# Patient Record
Sex: Female | Born: 1975 | Race: Black or African American | Hispanic: No | Marital: Married | Smoking: Former smoker
Health system: Southern US, Community
[De-identification: ages and names within clinical notes are randomized; demographics above are authoritative.]

## PROBLEM LIST (undated history)

## (undated) DIAGNOSIS — J189 Pneumonia, unspecified organism: Secondary | ICD-10-CM

## (undated) DIAGNOSIS — K859 Acute pancreatitis without necrosis or infection, unspecified: Secondary | ICD-10-CM

---

## 2013-10-16 ENCOUNTER — Emergency Department (HOSPITAL_COMMUNITY)
Admission: EM | Admit: 2013-10-16 | Discharge: 2013-10-16 | Disposition: A | Discharge status: Home / Self Care | Payer: Self-pay | Attending: Emergency Medicine | Admitting: Emergency Medicine

## 2013-10-16 ENCOUNTER — Encounter (HOSPITAL_COMMUNITY): Payer: Self-pay | Admitting: Emergency Medicine

## 2013-10-16 DIAGNOSIS — Z880 Allergy status to penicillin: Secondary | ICD-10-CM | POA: Insufficient documentation

## 2013-10-16 DIAGNOSIS — S61219A Laceration without foreign body of unspecified finger without damage to nail, initial encounter: Secondary | ICD-10-CM

## 2013-10-16 DIAGNOSIS — W208XXA Other cause of strike by thrown, projected or falling object, initial encounter: Secondary | ICD-10-CM | POA: Insufficient documentation

## 2013-10-16 DIAGNOSIS — Y9319 Activity, other involving water and watercraft: Secondary | ICD-10-CM | POA: Insufficient documentation

## 2013-10-16 DIAGNOSIS — Y9289 Other specified places as the place of occurrence of the external cause: Secondary | ICD-10-CM | POA: Insufficient documentation

## 2013-10-16 DIAGNOSIS — S61209A Unspecified open wound of unspecified finger without damage to nail, initial encounter: Secondary | ICD-10-CM | POA: Insufficient documentation

## 2013-10-16 NOTE — ED Notes (Signed)
Pt with laceration to left thumb from part of TV; bleeding controlled at present; pt sts last TD was 4 years ago

## 2013-10-16 NOTE — ED Provider Notes (Signed)
CSN: [id number]     Arrival date & time 10/16/13  1307 History  This chart was scribed for [employer], PA-C, working with [employer]. Rubin Payor, MD by Chestine Spore, ED Scribe. The patient was seen in room TR06C/TR06C at 1:34 PM.     Chief Complaint  Patient presents with  . Extremity Laceration     The history is provided by the patient. No language interpreter was used.   HPI Comments: Kristi Fleming is a 38 y.o. female who presents to the Emergency Department complaining of an extremity laceration onset last night. She states that she was trying to put a firestick in the back of her TV and it slipped.  She states that it slipped and cut her left thumb. She states that she is right handed. She states that she is having pain right now to her left thumb. She denies any other associated symptoms. She states that her last Tdap was 4 years ago.   History reviewed. No pertinent past medical history. History reviewed. No pertinent past surgical history. History reviewed. No pertinent family history. History  Substance Use Topics  . Smoking status: Never Smoker   . Smokeless tobacco: Not on file  . Alcohol Use: No   OB History   Grav Para Term Preterm Abortions TAB SAB Ect Mult Living                 Review of Systems  A complete 10 system review of systems was obtained and all systems are negative except as noted in the HPI and PMH.    Allergies  Clindamycin/lincomycin and Penicillins  Home Medications   Prior to Admission medications   Not on File   BP 131/79  Pulse 85  Temp(Src) 98.1 F (36.7 C) (Oral)  Resp 20  SpO2 99%  Physical Exam  Nursing note and vitals reviewed. Constitutional: She is oriented to person, place, and time. She appears well-developed and well-nourished. No distress.  HENT:  Head: Normocephalic.  Eyes: Conjunctivae and EOM are normal.  Cardiovascular: Normal rate.   Pulmonary/Chest: Effort normal. No stridor.  Musculoskeletal: Normal  range of motion.  Neurological: She is alert and oriented to person, place, and time.  Skin:  1.25 cm full-thickness laceration to distal volar side of left thumb. Full range of motion neurovascularly intact. Bleeding is controlled. No gross contamination.  Psychiatric: She has a normal mood and affect.    ED Course  Procedures (including critical care time) LACERATION REPAIR Performed by: Wynetta Emery Consent: Verbal consent obtained. Risks and benefits: risks, benefits and alternatives were discussed Consent given by: patient Patient identity confirmed: Wrist band   Wound explored to depth in good light on a bloodless field, with no foreign bodies seen or palpated.  Prepped and draped in normal sterile fashion    Tetanus: UTD  Laceration Location: Distal pad of left thumb  Laceration Length: 1.25cm  Anesthesia: digital block  Local anesthetic:  epinephrine  Anesthetic total: 5 ml  Irrigation method: Low Pressure  Amount of cleaning: 1L sterile NS  Skin closure: 5-0 ethylon  Number of sutures: 3  Technique: simple interupted  Patient tolerance: Patient tolerated the procedure well with no immediate complications.   Antibx ointment applied. Instructions for care discussed verbally and patient provided with additional written instructions for homecare and f/u.   DIAGNOSTIC STUDIES: Oxygen Saturation is 99% on room air, normal by my interpretation.    COORDINATION OF CARE: 2:10 PM-Discussed treatment plan which includes Laceration Repair with  pt at bedside and pt agreed to plan.   Labs Review Labs Reviewed - No data to display  Imaging Review No results found.   EKG Interpretation None      MDM   Final diagnoses:  Finger laceration, initial encounter    Filed Vitals:   10/16/13 1312 10/16/13 1428  BP: 131/79 110/60  Pulse: 85 83  Temp: 98.1 F (36.7 C)   TempSrc: Oral   Resp: 20 18  SpO2: 99% 97%    Medications - No data to  display  Kristi Fleming is a 38 y.o. female presenting with finger laceration.  No signs of tendon/joint involvement. Pressure irrigation performed. Laceration occurred < 8 hours prior to repair which was well tolerated. Pt has no co morbidities to effect normal wound healing. Discussed suture home care w pt and answered questions. Pt to f-u for wound check and suture removal in 7 days. Pt is hemodynamically stable with no complaints prior to dc.   Evaluation does not show pathology that would require ongoing emergent intervention or inpatient treatment. Pt is hemodynamically stable and mentating appropriately. Discussed findings and plan with patient/guardian, who agrees with care plan. All questions answered. Return precautions discussed and outpatient follow up given.    I personally performed the services described in this documentation, which was scribed in my presence. The recorded information has been reviewed and is accurate.    Wynetta Emeryicole Olivea Sonnen, PA-C 10/16/13 1715

## 2013-10-16 NOTE — Discharge Instructions (Signed)
Keep wound dry and do not remove dressing for 24 hours if possible. After that, wash gently morning and night (every 12 hours) with soap and water. Use a topical antibiotic ointment and cover with a bandaid or gauze.  °  °Do NOT use rubbing alcohol or hydrogen peroxide, do not soak the area °  °Present to your primary care doctor or the urgent care of your choice, or the ED for suture removal in 7-10 days. °  °Every attempt was made to remove foreign body (contaminants) from the wound.  However, there is always a chance that some may remain in the wound. This can  increase your risk of infection. °  °If you see signs of infection (warmth, redness, tenderness, pus, sharp increase in pain, fever, red streaking in the skin) immediately return to the emergency department. °  °After the wound heals fully, apply sunscreen for 6-12 months to minimize scarring.  ° ° °Laceration Care, Adult °A laceration is a cut or lesion that goes through all layers of the skin and into the tissue just beneath the skin. °TREATMENT  °Some lacerations may not require closure. Some lacerations may not be able to be closed due to an increased risk of infection. It is important to see your caregiver as soon as possible after an injury to minimize the risk of infection and maximize the opportunity for successful closure. °If closure is appropriate, pain medicines may be given, if needed. The wound will be cleaned to help prevent infection. Your caregiver will use stitches (sutures), staples, wound glue (adhesive), or skin adhesive strips to repair the laceration. These tools bring the skin edges together to allow for faster healing and a better cosmetic outcome. However, all wounds will heal with a scar. Once the wound has healed, scarring can be minimized by covering the wound with sunscreen during the day for 1 full year. °HOME CARE INSTRUCTIONS  °For sutures or staples: °· Keep the wound clean and dry. °· If you were given a bandage  (dressing), you should change it at least once a day. Also, change the dressing if it becomes wet or dirty, or as directed by your caregiver. °· Wash the wound with soap and water 2 times a day. Rinse the wound off with water to remove all soap. Pat the wound dry with a clean towel. °· After cleaning, apply a thin layer of the antibiotic ointment as recommended by your caregiver. This will help prevent infection and keep the dressing from sticking. °· You may shower as usual after the first 24 hours. Do not soak the wound in water until the sutures are removed. °· Only take over-the-counter or prescription medicines for pain, discomfort, or fever as directed by your caregiver. °· Get your sutures or staples removed as directed by your caregiver. °For skin adhesive strips: °· Keep the wound clean and dry. °· Do not get the skin adhesive strips wet. You may bathe carefully, using caution to keep the wound dry. °· If the wound gets wet, pat it dry with a clean towel. °· Skin adhesive strips will fall off on their own. You may trim the strips as the wound heals. Do not remove skin adhesive strips that are still stuck to the wound. They will fall off in time. °For wound adhesive: °· You may briefly wet your wound in the shower or bath. Do not soak or scrub the wound. Do not swim. Avoid periods of heavy perspiration until the skin adhesive has fallen off   on its own. After showering or bathing, gently pat the wound dry with a clean towel. °· Do not apply liquid medicine, cream medicine, or ointment medicine to your wound while the skin adhesive is in place. This may loosen the film before your wound is healed. °· If a dressing is placed over the wound, be careful not to apply tape directly over the skin adhesive. This may cause the adhesive to be pulled off before the wound is healed. °· Avoid prolonged exposure to sunlight or tanning lamps while the skin adhesive is in place. Exposure to ultraviolet light in the first  year will darken the scar. °· The skin adhesive will usually remain in place for 5 to 10 days, then naturally fall off the skin. Do not pick at the adhesive film. °You may need a tetanus shot if: °· You cannot remember when you had your last tetanus shot. °· You have never had a tetanus shot. °If you get a tetanus shot, your arm may swell, get red, and feel warm to the touch. This is common and not a problem. If you need a tetanus shot and you choose not to have one, there is a rare chance of getting tetanus. Sickness from tetanus can be serious. °SEEK MEDICAL CARE IF:  °· You have redness, swelling, or increasing pain in the wound. °· You see a red line that goes away from the wound. °· You have yellowish-white fluid (pus) coming from the wound. °· You have a fever. °· You notice a bad smell coming from the wound or dressing. °· Your wound breaks open before or after sutures have been removed. °· You notice something coming out of the wound such as wood or glass. °· Your wound is on your hand or foot and you cannot move a finger or toe. °SEEK IMMEDIATE MEDICAL CARE IF:  °· Your pain is not controlled with prescribed medicine. °· You have severe swelling around the wound causing pain and numbness or a change in color in your arm, hand, leg, or foot. °· Your wound splits open and starts bleeding. °· You have worsening numbness, weakness, or loss of function of any joint around or beyond the wound. °· You develop painful lumps near the wound or on the skin anywhere on your body. °MAKE SURE YOU:  °· Understand these instructions. °· Will watch your condition. °· Will get help right away if you are not doing well or get worse. °Document Released: 02/14/2005 Document Revised: 05/09/2011 Document Reviewed: 08/10/2010 °[employer]® Patient Information ©2015 [employer]. This information is not intended to replace advice given to you by your health care provider. Make sure you discuss any questions you have with your health  care provider. ° ° °

## 2013-10-17 NOTE — ED Provider Notes (Signed)
Medical screening examination/treatment/procedure(s) were performed by non-physician practitioner and as supervising physician I was immediately available for consultation/collaboration.   EKG Interpretation None       Ajanay Farve R. Tradarius Reinwald, MD 10/17/13 0659 

## 2013-11-16 ENCOUNTER — Emergency Department (HOSPITAL_COMMUNITY): Payer: Self-pay

## 2013-11-16 ENCOUNTER — Emergency Department (HOSPITAL_COMMUNITY)
Admission: EM | Admit: 2013-11-16 | Discharge: 2013-11-16 | Disposition: A | Discharge status: Home / Self Care | Payer: Self-pay | Attending: Emergency Medicine | Admitting: Emergency Medicine

## 2013-11-16 ENCOUNTER — Encounter (HOSPITAL_COMMUNITY): Payer: Self-pay | Admitting: Emergency Medicine

## 2013-11-16 DIAGNOSIS — Z880 Allergy status to penicillin: Secondary | ICD-10-CM | POA: Insufficient documentation

## 2013-11-16 DIAGNOSIS — K920 Hematemesis: Secondary | ICD-10-CM | POA: Insufficient documentation

## 2013-11-16 DIAGNOSIS — Z3202 Encounter for pregnancy test, result negative: Secondary | ICD-10-CM | POA: Insufficient documentation

## 2013-11-16 DIAGNOSIS — R197 Diarrhea, unspecified: Secondary | ICD-10-CM | POA: Insufficient documentation

## 2013-11-16 DIAGNOSIS — R1013 Epigastric pain: Secondary | ICD-10-CM | POA: Insufficient documentation

## 2013-11-16 DIAGNOSIS — R1011 Right upper quadrant pain: Secondary | ICD-10-CM | POA: Insufficient documentation

## 2013-11-16 DIAGNOSIS — K921 Melena: Secondary | ICD-10-CM | POA: Insufficient documentation

## 2013-11-16 HISTORY — DX: Acute pancreatitis without necrosis or infection, unspecified: K85.90

## 2013-11-16 LAB — COMPREHENSIVE METABOLIC PANEL
ALT: 15 U/L (ref 0–35)
AST: 15 U/L (ref 0–37)
Albumin: 3.5 g/dL (ref 3.5–5.2)
Alkaline Phosphatase: 67 U/L (ref 39–117)
Anion gap: 14 (ref 5–15)
BILIRUBIN TOTAL: 0.3 mg/dL (ref 0.3–1.2)
BUN: 10 mg/dL (ref 6–23)
CO2: 21 meq/L (ref 19–32)
CREATININE: 0.97 mg/dL (ref 0.50–1.10)
Calcium: 9 mg/dL (ref 8.4–10.5)
Chloride: 102 mEq/L (ref 96–112)
GFR calc Af Amer: 84 mL/min — ABNORMAL LOW (ref >90)
GFR, EST NON AFRICAN AMERICAN: 72 mL/min — AB (ref >90)
GLUCOSE: 107 mg/dL — AB (ref 70–99)
Potassium: 4 mEq/L (ref 3.7–5.3)
Sodium: 137 mEq/L (ref 137–147)
Total Protein: 7.6 g/dL (ref 6.0–8.3)

## 2013-11-16 LAB — CBC WITH DIFFERENTIAL/PLATELET
Basophils Absolute: 0 10*3/uL (ref 0.0–0.1)
Basophils Relative: 0 % (ref 0–1)
Eosinophils Absolute: 0.1 10*3/uL (ref 0.0–0.7)
Eosinophils Relative: 0 % (ref 0–5)
HEMATOCRIT: 38.5 % (ref 36.0–46.0)
Hemoglobin: 12.7 g/dL (ref 12.0–15.0)
LYMPHS ABS: 2.5 10*3/uL (ref 0.7–4.0)
LYMPHS PCT: 13 % (ref 12–46)
MCH: 27.4 pg (ref 26.0–34.0)
MCHC: 33 g/dL (ref 30.0–36.0)
MCV: 83 fL (ref 78.0–100.0)
MONO ABS: 0.9 10*3/uL (ref 0.1–1.0)
MONOS PCT: 5 % (ref 3–12)
NEUTROS ABS: 15.5 10*3/uL — AB (ref 1.7–7.7)
Neutrophils Relative %: 82 % — ABNORMAL HIGH (ref 43–77)
Platelets: 486 10*3/uL — ABNORMAL HIGH (ref 150–400)
RBC: 4.64 MIL/uL (ref 3.87–5.11)
RDW: 14.5 % (ref 11.5–15.5)
WBC: 19 10*3/uL — AB (ref 4.0–10.5)

## 2013-11-16 LAB — PREGNANCY, URINE: Preg Test, Ur: NEGATIVE

## 2013-11-16 LAB — LIPASE, BLOOD: LIPASE: 35 U/L (ref 11–59)

## 2013-11-16 LAB — TROPONIN I: Troponin I: <0.3 ng/mL (ref <0.30)

## 2013-11-16 MED ORDER — IOHEXOL 300 MG/ML  SOLN
100.0000 mL | Freq: Once | INTRAMUSCULAR | Status: AC | PRN
  Administered 2013-11-16: 100 mL via INTRAVENOUS

## 2013-11-16 MED ORDER — MORPHINE SULFATE 4 MG/ML IJ SOLN
4.0000 mg | Freq: Once | INTRAMUSCULAR | Status: AC
  Administered 2013-11-16: 4 mg via INTRAVENOUS
  Filled 2013-11-16: qty 1

## 2013-11-16 MED ORDER — OMEPRAZOLE 20 MG PO CPDR
20.0000 mg | DELAYED_RELEASE_CAPSULE | Freq: Every day | ORAL | Status: AC
Start: 2013-11-16 — End: ?

## 2013-11-16 MED ORDER — IOHEXOL 300 MG/ML  SOLN
50.0000 mL | Freq: Once | INTRAMUSCULAR | Status: AC | PRN
  Administered 2013-11-16: 50 mL via ORAL

## 2013-11-16 MED ORDER — HYDROCODONE-ACETAMINOPHEN 5-325 MG PO TABS: 1.0000 | ORAL_TABLET | ORAL | Status: AC | PRN

## 2013-11-16 MED ORDER — HYDROMORPHONE HCL 1 MG/ML IJ SOLN
1.0000 mg | Freq: Once | INTRAMUSCULAR | Status: AC
  Administered 2013-11-16: 1 mg via INTRAVENOUS
  Filled 2013-11-16: qty 1

## 2013-11-16 MED ORDER — GI COCKTAIL ~~LOC~~
30.0000 mL | Freq: Once | ORAL | Status: AC
  Administered 2013-11-16: 30 mL via ORAL
  Filled 2013-11-16: qty 30

## 2013-11-16 MED ORDER — ONDANSETRON 8 MG PO TBDP: 8.0000 mg | ORAL_TABLET | Freq: Three times a day (TID) | ORAL | Status: AC | PRN

## 2013-11-16 NOTE — ED Notes (Signed)
She states she experienced sudden onset of upper abd. Pain today at about 0900 hours which persists.  She also states she vomited x 1.  She states this is similar to pain she has had in the past which was dx as pancreatitis.

## 2013-11-16 NOTE — Discharge Instructions (Signed)

## 2013-11-16 NOTE — ED Provider Notes (Signed)
CSN: [id number]     Arrival date & time 11/16/13  1325 History   First MD Initiated Contact with Patient 11/16/13 1348     Chief Complaint  Patient presents with  . Abdominal Pain     (Consider location/radiation/quality/duration/timing/severity/associated sxs/prior Treatment) HPI Comments: Patient complains of sharp intermittent epigastric pain beginning this morning.  She reports the pain last for approximately 15-20 seconds and occurs every couple of minutes.  She reports previous history of pancreatitis with similar symptoms; No etiology discovered. Denies any history of cardiac or gallbladder problems.  Denies any fevers, chills, but did have episode on NB/NB vomiting once today. Pain does not radiate to back. Additionally reports multiple loose stools without blood beginning today after abdominal pain.     Patient is a 38 y.o. female presenting with abdominal pain.  Abdominal Pain Pain location:  Epigastric Pain quality: sharp   Pain radiates to:  Does not radiate Pain severity:  Severe Onset quality:  Sudden Duration:  4 hours Timing:  Intermittent Progression:  Unchanged Chronicity:  New Relieved by:  Nothing Worsened by:  Nothing tried Ineffective treatments:  Position changes Associated symptoms: chills, diarrhea, hematemesis, hematochezia, nausea and vomiting   Associated symptoms: no chest pain, no constipation, no cough, no dysuria, no fever, no hematuria and no shortness of breath     Past Medical History  Diagnosis Date  . Pancreatitis    No past surgical history on file. No family history on file. History  Substance Use Topics  . Smoking status: Never Smoker   . Smokeless tobacco: Not on file  . Alcohol Use: No   OB History   Grav Para Term Preterm Abortions TAB SAB Ect Mult Living                 Review of Systems  Constitutional: Positive for chills. Negative for fever.  Respiratory: Negative for cough, chest tightness and shortness of breath.    Cardiovascular: Negative for chest pain and palpitations.  Gastrointestinal: Positive for nausea, vomiting, abdominal pain, diarrhea, hematochezia and hematemesis. Negative for constipation.  Genitourinary: Negative for dysuria, urgency, hematuria and flank pain.      Allergies  Clindamycin/lincomycin; Penicillins; and Trimox  Home Medications   Prior to Admission medications   Not on File   BP 114/70  Pulse 77  Resp 16  SpO2 99%  LMP 11/05/2013 Physical Exam  ED Course  Procedures (including critical care time) Labs Review Labs Reviewed  CBC WITH DIFFERENTIAL - Abnormal; Notable for the following:    WBC 19.0 (*)    Platelets 486 (*)    Neutrophils Relative % 82 (*)    Neutro Abs 15.5 (*)    All other components within normal limits  COMPREHENSIVE METABOLIC PANEL - Abnormal; Notable for the following:    Glucose, Bld 107 (*)    GFR calc non Af Amer 72 (*)    GFR calc Af Amer 84 (*)    All other components within normal limits  LIPASE, BLOOD  TROPONIN I  PREGNANCY, URINE    Imaging Review No results found.   EKG Interpretation None      MDM   Final diagnoses:  Epigastric pain   Acute onset of epigastric pain with vomiting and diarrhea x 1 day. She has hx of pancreatitis with similar symptoms but lipase wnl. Troponin negative and EKG wnl. Due to persistent epigastric pain and WBC elevated to 19 CT abdomen pelvis ordered. Care turned over to on coming ED physician  pending CT read.    Jamal Collin, MD 11/16/13 [phone]

## 2013-11-16 NOTE — ED Notes (Signed)
Lab called, urine sent down was not labeled. Needs to be recollected. Radene Gunning, NT made aware.

## 2013-11-16 NOTE — ED Notes (Signed)
Bed: WA09 Expected date:  Expected time:  Means of arrival:  Comments: abd pain 

## 2013-11-16 NOTE — ED Provider Notes (Signed)
6:25 PM Patient is feeling better at this time.  Abdominal exam is benign.  Epigastric discomfort that was sharp.  Could represent gastroesophageal reflux disease.  Patient be sent home and nausea medicine, pain medicine, PPI.  Outpatient PCP followup.  I recommended that she also see gastroenterologist in her system.  She understands to return to the ER for new or worsening symptoms.  Ct Abdomen Pelvis W Contrast  11/16/2013   CLINICAL DATA:  Sudden onset upper abdominal pain. Vomiting x1. Reported history of pancreatitis.  EXAM: CT ABDOMEN AND PELVIS WITH CONTRAST  TECHNIQUE: Multidetector CT imaging of the abdomen and pelvis was performed using the standard protocol following bolus administration of intravenous contrast.  CONTRAST:  OMNIPAQUE IOHEXOL 300 MG/ML SOLN, 50mL OMNIPAQUE IOHEXOL 300 MG/ML SOLN  COMPARISON:  None.  FINDINGS: Lung bases essentially clear.  Heart is normal in size.  Liver, spleen, gallbladder:  Normal.  No bile duct dilation.  Pancreas is within normal limits.  No CT evidence of pancreatitis.  No adrenal masses. Normal kidneys, ureters and bladder. Uterus and adnexa are unremarkable. No pathologically enlarged lymph nodes. No abnormal fluid collections.  Colon and small bowel are unremarkable.  Normal appendix.  No bony abnormality.  IMPRESSION: 1. Normal exam.  Normal appendix visualized.   Electronically Signed   By: Amie Portland M.D.   On: 11/16/2013 17:54  I personally reviewed the imaging tests through PACS system I reviewed available ER/hospitalization records through the EMR   Lyanne Co, MD 11/16/13 1825

## 2013-11-19 NOTE — ED Provider Notes (Signed)
I saw and evaluated the patient, reviewed the resident's note and I agree with the findings and plan.   .Face to face Exam:  General:  Awake HEENT:  Atraumatic Resp:  Normal effort Abd:  Nondistended Neuro:No focal weakness   Nelia Shi, MD 11/19/13 2154

## 2013-11-29 ENCOUNTER — Other Ambulatory Visit (HOSPITAL_COMMUNITY): Payer: Self-pay | Admitting: Internal Medicine

## 2013-11-29 DIAGNOSIS — Z1231 Encounter for screening mammogram for malignant neoplasm of breast: Secondary | ICD-10-CM

## 2013-12-05 ENCOUNTER — Ambulatory Visit (HOSPITAL_COMMUNITY): Payer: Self-pay

## 2013-12-11 ENCOUNTER — Ambulatory Visit (HOSPITAL_COMMUNITY): Payer: Self-pay

## 2013-12-11 ENCOUNTER — Ambulatory Visit (HOSPITAL_COMMUNITY)
Admission: RE | Admit: 2013-12-11 | Discharge: 2013-12-11 | Disposition: A | Discharge status: Home / Self Care | Payer: Self-pay | Source: Ambulatory Visit | Attending: Internal Medicine | Admitting: Internal Medicine

## 2013-12-11 DIAGNOSIS — Z1231 Encounter for screening mammogram for malignant neoplasm of breast: Secondary | ICD-10-CM

## 2015-04-28 IMAGING — CT CT ABD-PELV W/ CM
1 of 3 series · 14 of 32 positions shown, 19 images · IV contrast (OMNIPAQUE 300)
Comparison: None.

CLINICAL DATA: Sudden onset upper abdominal pain. Vomiting x1.
Reported history of pancreatitis.

EXAM:
CT ABDOMEN AND PELVIS WITH CONTRAST
TECHNIQUE: Multidetector CT imaging of the abdomen and pelvis was performed
using the standard protocol following bolus administration of
intravenous contrast.
CONTRAST:  100mL OMNIPAQUE IOHEXOL 300 MG/ML SOLN, 50mL OMNIPAQUE
IOHEXOL 300 MG/ML SOLN

[Series 2: abd/pel with · axial · 0.80mm/px · z∈[+1084,+1470]mm · 14 of 87 slices shown, 19 images]
[im 5/87  soft-tissue]
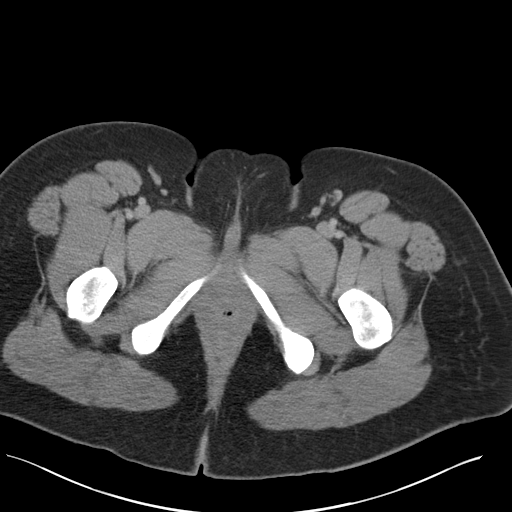
[im 5/87  bone]
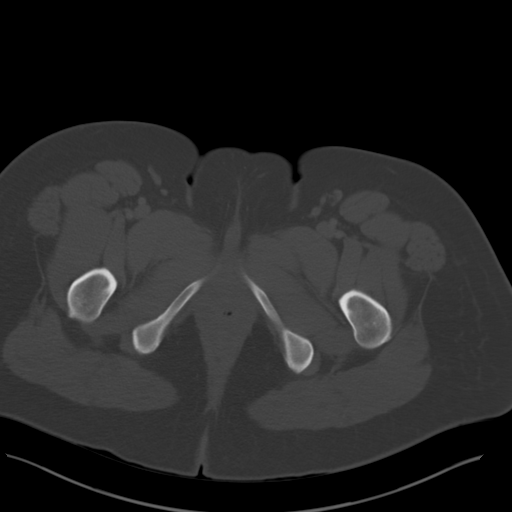
[im 14/87  soft-tissue]
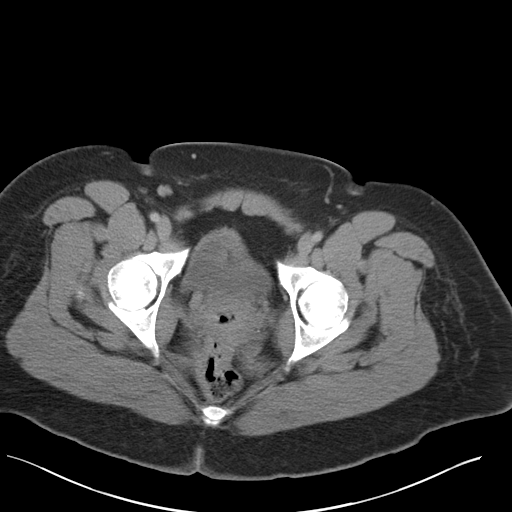
[im 19/87  soft-tissue]
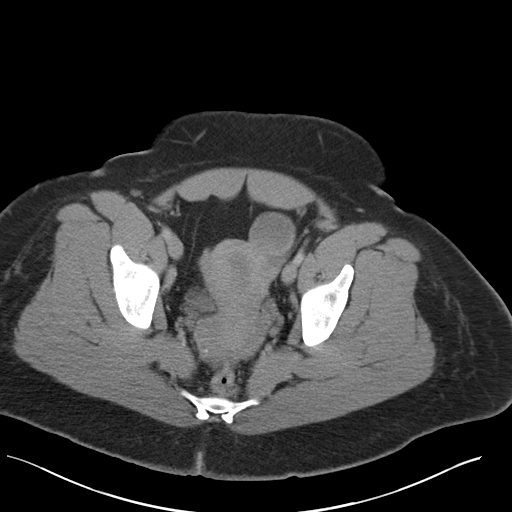
[im 23/87  soft-tissue]
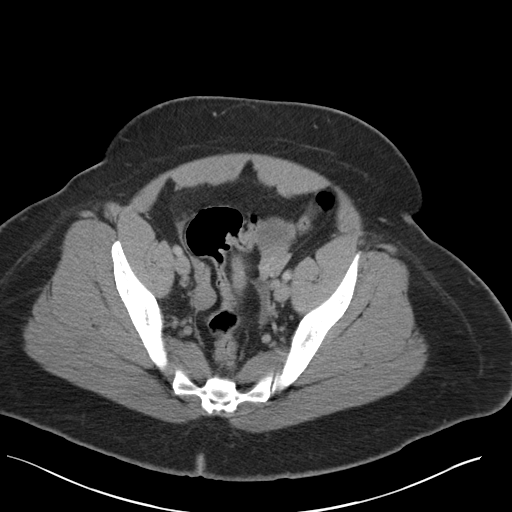
[im 32/87  soft-tissue]
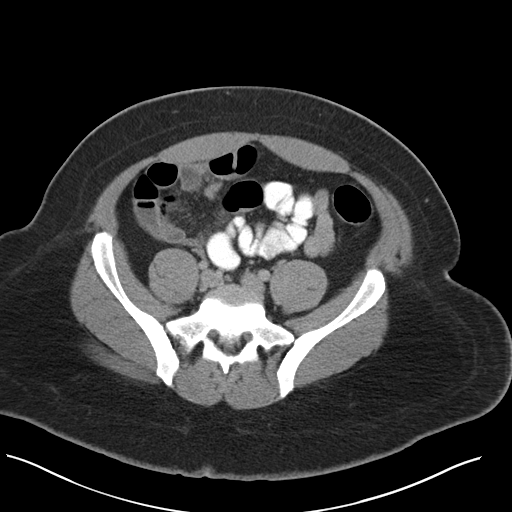
[im 37/87  soft-tissue]
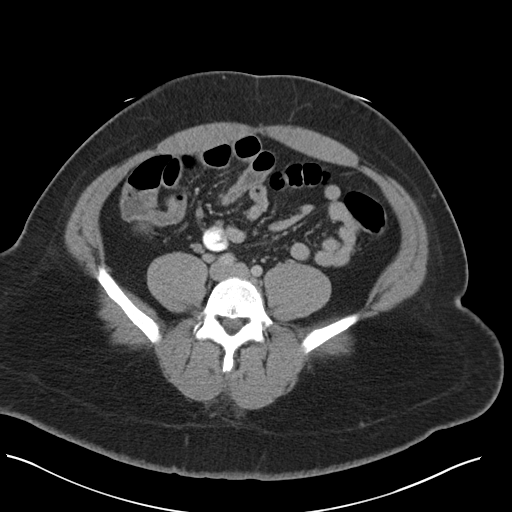
[im 46/87  soft-tissue]
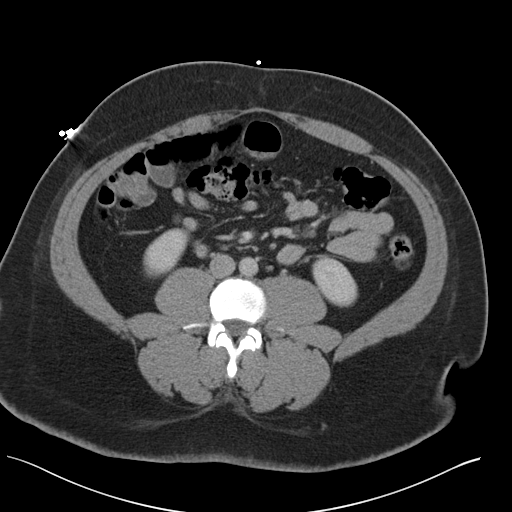
[im 50/87  soft-tissue]
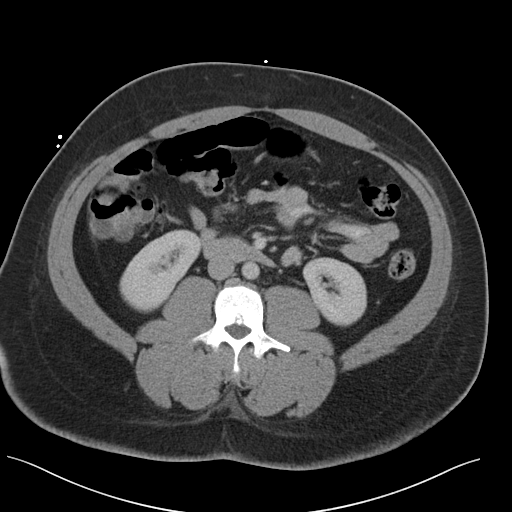
[im 55/87  soft-tissue]
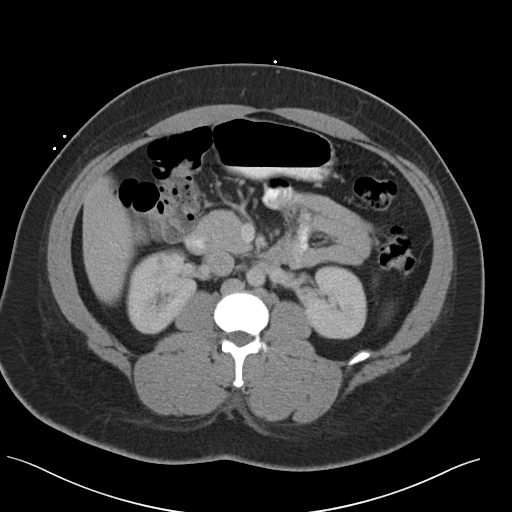
[im 55/87  bone]
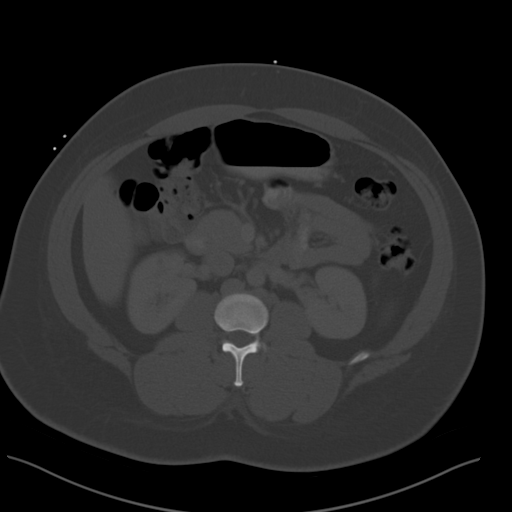
[im 64/87  soft-tissue]
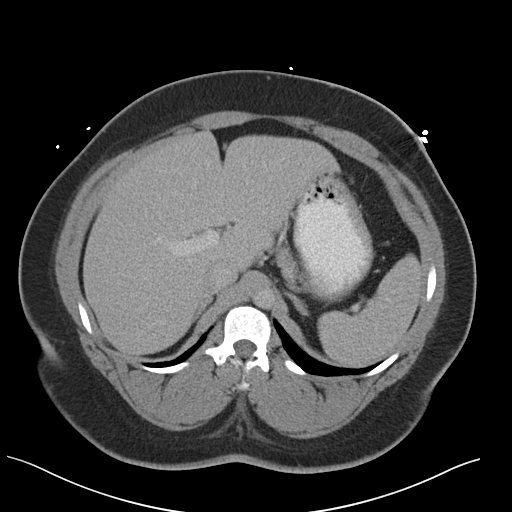
[im 68/87  soft-tissue]
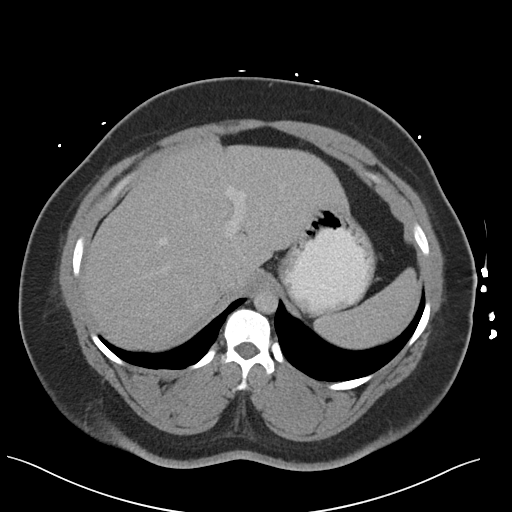
[im 68/87  lung]
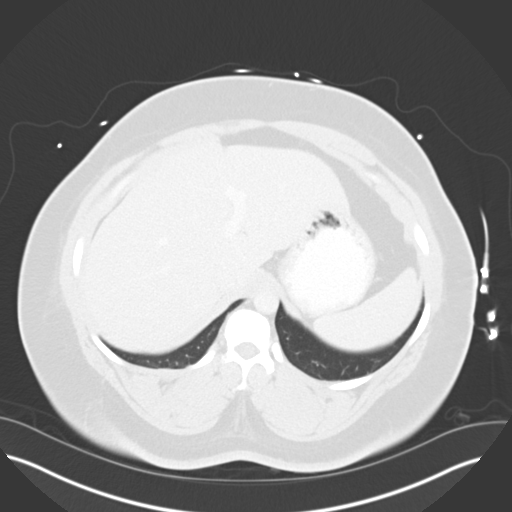
[im 73/87  soft-tissue]
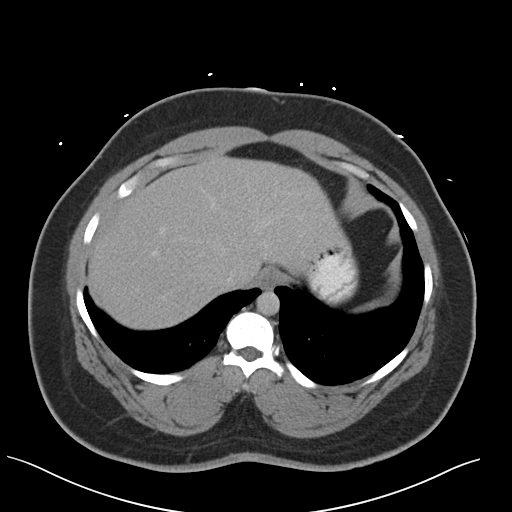
[im 73/87  lung]
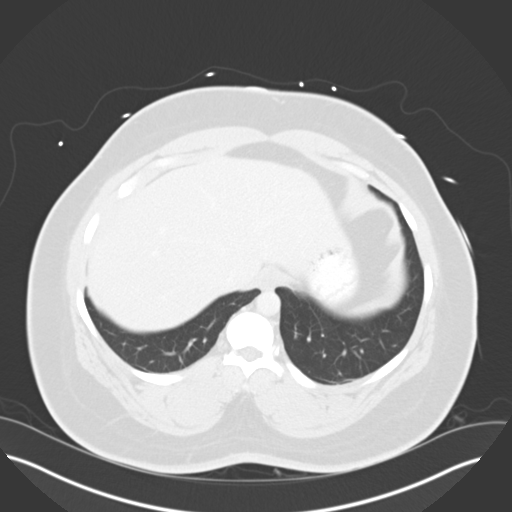
[im 77/87  lung]
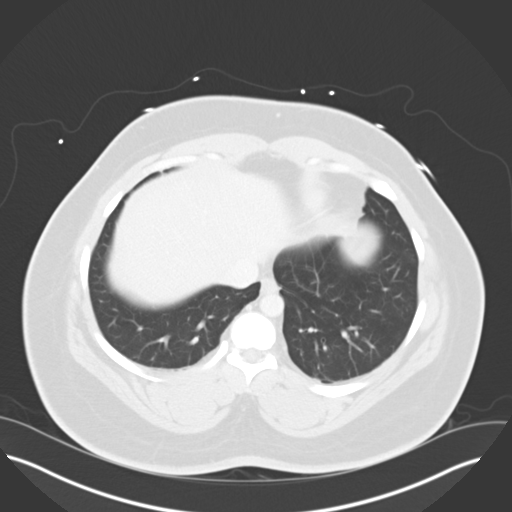
[im 82/87  soft-tissue]
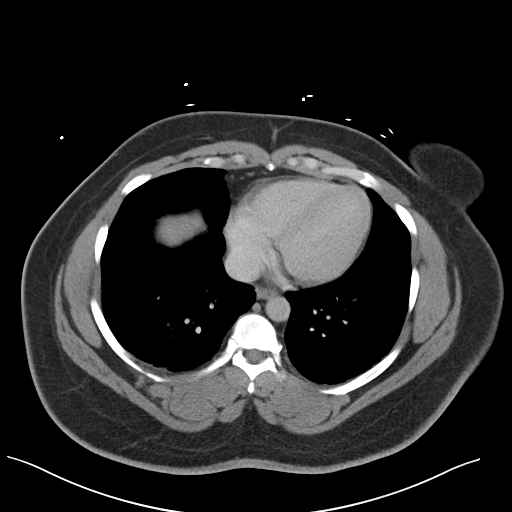
[im 82/87  lung]
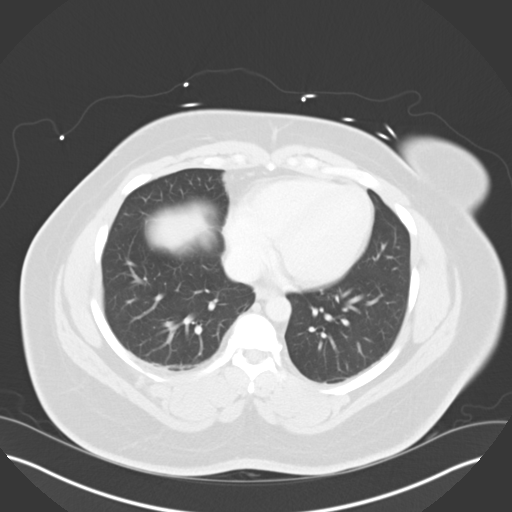

[14 of 32 positions shown; findings below may reference images not displayed]

FINDINGS: Lung bases essentially clear.  Heart is normal in size.

Liver, spleen, gallbladder:  Normal.  No bile duct dilation.

Pancreas is within normal limits.  No CT evidence of pancreatitis.

No adrenal masses. Normal kidneys, ureters and bladder. Uterus and
adnexa are unremarkable. No pathologically enlarged lymph nodes. No
abnormal fluid collections.

Colon and small bowel are unremarkable.  Normal appendix.

No bony abnormality.
IMPRESSION: 1. Normal exam.  Normal appendix visualized.

## 2016-02-01 ENCOUNTER — Encounter (HOSPITAL_COMMUNITY): Payer: Self-pay | Admitting: Emergency Medicine

## 2016-02-01 ENCOUNTER — Emergency Department (HOSPITAL_COMMUNITY)
Admission: EM | Admit: 2016-02-01 | Discharge: 2016-02-01 | Disposition: A | Discharge status: Home / Self Care | Payer: Self-pay | Attending: Emergency Medicine | Admitting: Emergency Medicine

## 2016-02-01 DIAGNOSIS — J069 Acute upper respiratory infection, unspecified: Secondary | ICD-10-CM | POA: Insufficient documentation

## 2016-02-01 LAB — RAPID STREP SCREEN (MED CTR MEBANE ONLY): STREPTOCOCCUS, GROUP A SCREEN (DIRECT): NEGATIVE

## 2016-02-01 MED ORDER — AZITHROMYCIN 250 MG PO TABS: 250.0000 mg | ORAL_TABLET | Freq: Every day | ORAL | 0 refills | Status: AC

## 2016-02-01 MED ORDER — PSEUDOEPHEDRINE HCL 30 MG PO TABS: 30.0000 mg | ORAL_TABLET | Freq: Four times a day (QID) | ORAL | 0 refills | Status: AC | PRN

## 2016-02-01 MED ORDER — BENZONATATE 100 MG PO CAPS: 100.0000 mg | ORAL_CAPSULE | Freq: Three times a day (TID) | ORAL | 0 refills | Status: AC

## 2016-02-01 NOTE — ED Provider Notes (Signed)
[facility]-EMERGENCY DEPT Provider Note   CSN: [id number] Arrival date & time: 02/01/16  [id number]     History   Chief Complaint Chief Complaint  Patient presents with  . Sore Throat    HPI Kristi Fleming is a 40 y.o. female.  HPI Kristi Fleming is a 40 y.o. female with history of pancreatitis, presents to emergency department complaining of nasal congestion, headache, sore throat, cough. Symptoms for 2 weeks. She reports a thick productive sputum when coughing. She reports thick nasal congestion. She reports sore throat, with no difficulty swallowing or change in voice. She has been taking numerous over-the-counter medications with no improvement. She states unknown fever, did not check it at home. 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 Denies shortness of breath or chest pain. Denies any nausea, vomiting, diarrhea. No abdominal pain. No urinary symptoms. Nothing making her symptoms better or worse.   Past Medical History:  Diagnosis Date  . Pancreatitis     There are no active problems to display for this patient.   History reviewed. No pertinent surgical history.  OB History    No data available       Home Medications    Prior to Admission medications   Medication Sig Start Date End Date Taking? Authorizing Provider  HYDROcodone-acetaminophen (NORCO/VICODIN) 5-325 MG per tablet Take 1 tablet by mouth every 4 (four) hours as needed for moderate pain. 11/16/13   Azalia BilisKevin Campos, MD  omeprazole (PRILOSEC) 20 MG capsule Take 1 capsule (20 mg total) by mouth daily. 11/16/13   Azalia BilisKevin Campos, MD  ondansetron (ZOFRAN ODT) 8 MG disintegrating tablet Take 1 tablet (8 mg total) by mouth every 8 (eight) hours as needed for nausea or vomiting. 11/16/13   Azalia BilisKevin Campos, MD    Family History No family history on file.  Social History Social History  Substance Use Topics  . Smoking status: Never Smoker  . Smokeless tobacco:  Never Used  . Alcohol use No     Allergies   Clindamycin/lincomycin; Penicillins; and Trimox [amoxicillin]   Review of Systems Review of Systems  Constitutional: Positive for chills. Negative for fever.  HENT: Positive for congestion and sore throat. Negative for trouble swallowing and voice change.   Respiratory: Positive for cough. Negative for chest tightness and shortness of breath.   Cardiovascular: Negative for chest pain, palpitations and leg swelling.  Gastrointestinal: Negative for abdominal pain, diarrhea, nausea and vomiting.  Genitourinary: Negative for dysuria, flank pain, pelvic pain, vaginal bleeding, vaginal discharge and vaginal pain.  Musculoskeletal: Negative for arthralgias, myalgias, neck pain and neck stiffness.  Skin: Negative for rash.  Neurological: Positive for headaches. Negative for dizziness and weakness.  All other systems reviewed and are negative.    Physical Exam Updated Vital Signs BP 133/87 (BP Location: Right Arm)   Pulse 105   Temp 98 F (36.7 C) (Oral)   Resp 20   Ht 5\' 5"  (1.651 m)   Wt 108.9 kg   LMP 01/13/2016   SpO2 98%   BMI 39.94 kg/m   Physical Exam  Constitutional: She appears well-developed and well-nourished. No distress.  HENT:  Head: Normocephalic.  Right Ear: Tympanic membrane, external ear and ear canal normal.  Left Ear: Tympanic membrane, external ear and ear canal normal.  Nose: Mucosal edema and rhinorrhea present.  Mouth/Throat: Uvula is midline and mucous membranes are normal. Posterior oropharyngeal erythema present. No oropharyngeal exudate or posterior oropharyngeal edema. Tonsils are  1+ on the right. Tonsils are 1+ on the left.  Eyes: Conjunctivae are normal.  Neck: Neck supple.  Cardiovascular: Normal rate, regular rhythm and normal heart sounds.   Pulmonary/Chest: Effort normal and breath sounds normal. No respiratory distress. She has no wheezes. She has no rales.  Abdominal: Soft. Bowel sounds are  normal. She exhibits no distension. There is no tenderness. There is no rebound and no guarding.  Musculoskeletal: She exhibits no edema.  Neurological: She is alert.  Skin: Skin is warm and dry.  Psychiatric: She has a normal mood and affect. Her behavior is normal.  Nursing note and vitals reviewed.    ED Treatments / Results  Labs (all labs ordered are listed, but only abnormal results are displayed) Labs Reviewed  RAPID STREP SCREEN (NOT AT [facility])  CULTURE, GROUP A STREP [facility]    EKG  EKG Interpretation None       Radiology No results found.  Procedures Procedures (including critical care time)  Medications Ordered in ED Medications - No data to display   Initial Impression / Assessment and Plan / ED Course  I have reviewed the triage vital signs and the nursing notes.  Pertinent labs & imaging results that were available during my care of the patient were reviewed by me and considered in my medical decision making (see chart for details).  Clinical Course    Pt in ED with sore throat, nasal congestion, cough, for 2 weeks. Pt is an ex-smoker. Lungs clear. VS show mild tachcyardia, otherwise normal. Given prolonged course of illness, will give zpack. Otherwise symptomatic tx with tylenol, motrin, sudafed, tessalon. Follow up with pcp as needed. Return precautions discussed.   Vitals:   02/01/16 0243 02/01/16 0244 02/01/16 0324  BP:  (!) 149/103 133/87  Pulse:  105   Resp:  20   Temp:  98 F (36.7 C)   TempSrc:  Oral   SpO2:  98%   Weight: 108.9 kg    Height: 5\' 5"  (1.651 m)       Final Clinical Impressions(s) / ED Diagnoses   Final diagnoses:  Viral upper respiratory tract infection    New Prescriptions New Prescriptions   AZITHROMYCIN (ZITHROMAX) 250 MG TABLET    Take 1 tablet (250 mg total) by mouth daily. Take first 2 tablets together, then 1 every day until finished.   BENZONATATE (TESSALON) 100 MG CAPSULE    Take 1 capsule (100 mg total) by  mouth every 8 (eight) hours.   PSEUDOEPHEDRINE (SUDAFED) 30 MG TABLET    Take 1 tablet (30 mg total) by mouth every 6 (six) hours as needed for congestion.     Jaynie Crumbleatyana Laquasia Pincus, PA-C 02/01/16 [id number]    Shon Batonourtney F Horton, MD 02/01/16 [phone]

## 2016-02-01 NOTE — ED Triage Notes (Addendum)
C/o sore throat and congestion x 1 week.  Reports difficulty swallowing, no fever.

## 2016-02-01 NOTE — Discharge Instructions (Signed)
Take tylenol and motrin for pain and fever. Salt water gargles for sore throat. Take zithromax as prescribed until all gone. Take sudafed for congestion. Take tessalon for cough. Follow up with family doctor.

## 2016-02-03 LAB — CULTURE, GROUP A STREP (THRC)

## 2016-02-08 ENCOUNTER — Encounter (HOSPITAL_COMMUNITY): Payer: Self-pay | Admitting: Family Medicine

## 2016-02-08 ENCOUNTER — Emergency Department (HOSPITAL_COMMUNITY): Payer: Medicaid Other

## 2016-02-08 DIAGNOSIS — J069 Acute upper respiratory infection, unspecified: Secondary | ICD-10-CM | POA: Insufficient documentation

## 2016-02-08 DIAGNOSIS — Z87891 Personal history of nicotine dependence: Secondary | ICD-10-CM | POA: Insufficient documentation

## 2016-02-08 DIAGNOSIS — Z79899 Other long term (current) drug therapy: Secondary | ICD-10-CM | POA: Insufficient documentation

## 2016-02-08 MED ORDER — ACETAMINOPHEN 325 MG PO TABS
650.0000 mg | ORAL_TABLET | Freq: Once | ORAL | Status: AC | PRN
  Administered 2016-02-08: 650 mg via ORAL
  Filled 2016-02-08: qty 2

## 2016-02-08 NOTE — ED Triage Notes (Signed)
Patient was [facility] last Sunday and diagnosed with a UTI. She was given Z-pack and completed it on Friday. Pt has had a cough that has not improved. New symptoms consist of mid-back pain, fever, vomiting, and increase thirst.

## 2016-02-09 ENCOUNTER — Emergency Department (HOSPITAL_COMMUNITY)
Admission: EM | Admit: 2016-02-09 | Discharge: 2016-02-09 | Disposition: A | Discharge status: Home / Self Care | Payer: Medicaid Other | Attending: Emergency Medicine | Admitting: Emergency Medicine

## 2016-02-09 DIAGNOSIS — B9789 Other viral agents as the cause of diseases classified elsewhere: Secondary | ICD-10-CM

## 2016-02-09 DIAGNOSIS — J069 Acute upper respiratory infection, unspecified: Secondary | ICD-10-CM

## 2016-02-09 HISTORY — DX: Pneumonia, unspecified organism: J18.9

## 2016-02-09 LAB — COMPREHENSIVE METABOLIC PANEL
ALBUMIN: 4 g/dL (ref 3.5–5.0)
ALK PHOS: 76 U/L (ref 38–126)
ALT: 19 U/L (ref 14–54)
ANION GAP: 6 (ref 5–15)
AST: 19 U/L (ref 15–41)
BUN: 14 mg/dL (ref 6–20)
CO2: 25 mmol/L (ref 22–32)
Calcium: 8.9 mg/dL (ref 8.9–10.3)
Chloride: 103 mmol/L (ref 101–111)
Creatinine, Ser: 1.19 mg/dL — ABNORMAL HIGH (ref 0.44–1.00)
GFR calc Af Amer: >60 mL/min (ref >60)
GFR calc non Af Amer: 55 mL/min — ABNORMAL LOW (ref >60)
GLUCOSE: 127 mg/dL — AB (ref 65–99)
Potassium: 4 mmol/L (ref 3.5–5.1)
SODIUM: 134 mmol/L — AB (ref 135–145)
Total Bilirubin: 0.7 mg/dL (ref 0.3–1.2)
Total Protein: 8 g/dL (ref 6.5–8.1)

## 2016-02-09 LAB — CBC WITH DIFFERENTIAL/PLATELET
Basophils Absolute: 0 10*3/uL (ref 0.0–0.1)
Basophils Relative: 0 %
Eosinophils Absolute: 0 10*3/uL (ref 0.0–0.7)
Eosinophils Relative: 0 %
HCT: 38.9 % (ref 36.0–46.0)
HEMOGLOBIN: 12.9 g/dL (ref 12.0–15.0)
LYMPHS ABS: 2.2 10*3/uL (ref 0.7–4.0)
Lymphocytes Relative: 14 %
MCH: 27.3 pg (ref 26.0–34.0)
MCHC: 33.2 g/dL (ref 30.0–36.0)
MCV: 82.2 fL (ref 78.0–100.0)
MONOS PCT: 4 %
Monocytes Absolute: 0.6 10*3/uL (ref 0.1–1.0)
NEUTROS PCT: 82 %
Neutro Abs: 12.8 10*3/uL — ABNORMAL HIGH (ref 1.7–7.7)
Platelets: 507 10*3/uL — ABNORMAL HIGH (ref 150–400)
RBC: 4.73 MIL/uL (ref 3.87–5.11)
RDW: 14.8 % (ref 11.5–15.5)
WBC: 15.7 10*3/uL — ABNORMAL HIGH (ref 4.0–10.5)

## 2016-02-09 LAB — URINALYSIS, ROUTINE W REFLEX MICROSCOPIC
Bacteria, UA: NONE SEEN
Bilirubin Urine: NEGATIVE
Glucose, UA: NEGATIVE mg/dL
Ketones, ur: NEGATIVE mg/dL
Leukocytes, UA: NEGATIVE
Nitrite: NEGATIVE
Protein, ur: NEGATIVE mg/dL
Specific Gravity, Urine: 1.008 (ref 1.005–1.030)
pH: 5 (ref 5.0–8.0)

## 2016-02-09 LAB — LIPASE, BLOOD: Lipase: 38 U/L (ref 11–51)

## 2016-02-09 MED ORDER — IPRATROPIUM-ALBUTEROL 0.5-2.5 (3) MG/3ML IN SOLN
3.0000 mL | Freq: Once | RESPIRATORY_TRACT | Status: AC
  Administered 2016-02-09: 3 mL via RESPIRATORY_TRACT
  Filled 2016-02-09: qty 3

## 2016-02-09 MED ORDER — SODIUM CHLORIDE 0.9 % IV BOLUS (SEPSIS)
1000.0000 mL | Freq: Once | INTRAVENOUS | Status: AC
  Administered 2016-02-09: 1000 mL via INTRAVENOUS

## 2016-02-09 MED ORDER — DEXAMETHASONE 4 MG PO TABS
12.0000 mg | ORAL_TABLET | Freq: Once | ORAL | Status: AC
  Administered 2016-02-09: 12 mg via ORAL
  Filled 2016-02-09: qty 3

## 2016-02-09 MED ORDER — ONDANSETRON HCL 4 MG/2ML IJ SOLN
4.0000 mg | Freq: Once | INTRAMUSCULAR | Status: AC
  Administered 2016-02-09: 4 mg via INTRAVENOUS
  Filled 2016-02-09: qty 2

## 2016-02-09 MED ORDER — ALBUTEROL SULFATE HFA 108 (90 BASE) MCG/ACT IN AERS: 2.0000 | INHALATION_SPRAY | RESPIRATORY_TRACT | 0 refills | Status: AC | PRN

## 2016-02-09 MED ORDER — ALBUTEROL SULFATE (2.5 MG/3ML) 0.083% IN NEBU: 2.5000 mg | INHALATION_SOLUTION | Freq: Four times a day (QID) | RESPIRATORY_TRACT | 12 refills | Status: AC | PRN

## 2016-02-09 MED ORDER — GI COCKTAIL ~~LOC~~
30.0000 mL | Freq: Once | ORAL | Status: AC
Start: 2016-02-09 — End: 2016-02-09
  Administered 2016-02-09: 30 mL via ORAL
  Filled 2016-02-09: qty 30

## 2016-02-09 MED ORDER — LOPERAMIDE HCL 2 MG PO CAPS
4.0000 mg | ORAL_CAPSULE | Freq: Once | ORAL | Status: AC
  Administered 2016-02-09: 4 mg via ORAL
  Filled 2016-02-09: qty 2

## 2016-02-09 NOTE — Discharge Instructions (Signed)
Take loperamide (Imodium AD) as needed for diarrhea.  Drink plenty of fluids. Take acetaminophen (Tylenol) or ibuprofen (Advil, Motrin) as needed for diarrhea.

## 2016-02-09 NOTE — ED Provider Notes (Signed)
[facility]-EMERGENCY DEPT Provider Note   CSN: [id number] Arrival date & time: 02/08/16  2243 By signing my name below, I, Levon HedgerElizabeth Hall, attest that this documentation has been prepared under the direction and in the presence of Dione Boozeavid Divina Neale, MD . Electronically Signed: Levon HedgerElizabeth Hall, Scribe. 02/09/2016. 2:43 AM.   History   Chief Complaint Chief Complaint  Patient presents with  . Cough  . Fever    HPI Kristi Fleming is a 40 y.o. female who presents to the Emergency Department complaining of constant, gradually improving abdominal pain that began this morning. Pt rates her pain as 6/10 in severity.  She ntoes associated fever (tmax 103), chills, dizziness, nausea, vomiting, generalized body aches and mid-back pain which began tonight. She was given tylenol in the ED with relief of her fever, abdominal pain and back pain. Pt also notes a persistent cough onset 2.5 weeks ago. Pt states she has been able to drink normally over this time span, but reports feeling dehydrated today. She denies any other associated symptoms.   The history is provided by the patient. No language interpreter was used.   Past Medical History:  Diagnosis Date  . Pancreatitis   . PNA (pneumonia)     There are no active problems to display for this patient.   History reviewed. No pertinent surgical history.  OB History    No data available       Home Medications    Prior to Admission medications   Medication Sig Start Date End Date Taking? Authorizing Provider  azithromycin (ZITHROMAX) 250 MG tablet Take 1 tablet (250 mg total) by mouth daily. Take first 2 tablets together, then 1 every day until finished. 02/01/16   Tatyana Kirichenko, PA-C  benzonatate (TESSALON) 100 MG capsule Take 1 capsule (100 mg total) by mouth every 8 (eight) hours. 02/01/16   Tatyana Kirichenko, PA-C  HYDROcodone-acetaminophen (NORCO/VICODIN) 5-325 MG per tablet Take 1 tablet by mouth every 4 (four) hours as needed for moderate  pain. 11/16/13   Azalia BilisKevin Campos, MD  omeprazole (PRILOSEC) 20 MG capsule Take 1 capsule (20 mg total) by mouth daily. 11/16/13   Azalia BilisKevin Campos, MD  ondansetron (ZOFRAN ODT) 8 MG disintegrating tablet Take 1 tablet (8 mg total) by mouth every 8 (eight) hours as needed for nausea or vomiting. 11/16/13   Azalia BilisKevin Campos, MD  pseudoephedrine (SUDAFED) 30 MG tablet Take 1 tablet (30 mg total) by mouth every 6 (six) hours as needed for congestion. 02/01/16   Jaynie Crumbleatyana Kirichenko, PA-C    Family History History reviewed. No pertinent family history.  Social History Social History  Substance Use Topics  . Smoking status: Former Games developermoker  . Smokeless tobacco: Never Used  . Alcohol use No    Allergies   Clindamycin/lincomycin; Penicillins; and Trimox [amoxicillin]  Review of Systems Review of Systems  Constitutional: Positive for chills and fever.  Respiratory: Positive for cough.   Gastrointestinal: Positive for abdominal pain, nausea and vomiting.  Neurological: Positive for dizziness and weakness.  All other systems reviewed and are negative.  Physical Exam Updated Vital Signs BP 137/79 (BP Location: Left Arm)   Pulse 103   Temp 99.4 F (37.4 C) (Oral)   Resp 18   Ht 5\' 4"  (1.626 m)   Wt 240 lb (108.9 kg)   LMP 01/13/2016   SpO2 95%   BMI 41.20 kg/m   Physical Exam  Constitutional: She is oriented to person, place, and time. She appears well-developed and well-nourished.  HENT:  Head: Normocephalic and  atraumatic.  Eyes: EOM are normal. Pupils are equal, round, and reactive to light.  Neck: Normal range of motion. Neck supple. No JVD present.  Cardiovascular: Normal rate, regular rhythm and normal heart sounds.   No murmur heard. Pulmonary/Chest: Effort normal and breath sounds normal. She has no wheezes. She has no rales. She exhibits no tenderness.  Abdominal: Soft. Bowel sounds are normal. She exhibits no distension and no mass. There is tenderness.  Mild epigastric tenderness     Musculoskeletal: Normal range of motion. She exhibits no edema.  Lymphadenopathy:    She has no cervical adenopathy.  Neurological: She is alert and oriented to person, place, and time. No cranial nerve deficit. She exhibits normal muscle tone. Coordination normal.  Skin: Skin is warm and dry. No rash noted.  Psychiatric: She has a normal mood and affect. Her behavior is normal. Judgment and thought content normal.  Nursing note and vitals reviewed.  ED Treatments / Results  DIAGNOSTIC STUDIES:  Oxygen Saturation is 95% on RA, adequate by my interpretation.    COORDINATION OF CARE:  2:38 AM Discussed treatment plan with pt at bedside and pt agreed to plan.   Labs (all labs ordered are listed, but only abnormal results are displayed) Labs Reviewed  URINALYSIS, ROUTINE W REFLEX MICROSCOPIC - Abnormal; Notable for the following:       Result Value   Color, Urine STRAW (*)    Hgb urine dipstick MODERATE (*)    Squamous Epithelial / LPF 0-5 (*)    All other components within normal limits  COMPREHENSIVE METABOLIC PANEL - Abnormal; Notable for the following:    Sodium 134 (*)    Glucose, Bld 127 (*)    Creatinine, Ser 1.19 (*)    GFR calc non Af Amer 55 (*)    All other components within normal limits  CBC WITH DIFFERENTIAL/PLATELET - Abnormal; Notable for the following:    WBC 15.7 (*)    Platelets 507 (*)    Neutro Abs 12.8 (*)    All other components within normal limits  LIPASE, BLOOD    Radiology Dg Chest 2 View  Result Date: 02/09/2016 CLINICAL DATA:  Cough and congestion EXAM: CHEST  2 VIEW COMPARISON:  None. FINDINGS: The heart size and mediastinal contours are within normal limits. Both lungs are clear. The visualized skeletal structures are unremarkable. IMPRESSION: No active cardiopulmonary disease. Electronically Signed   By: Jasmine PangKim  Fujinaga M.D.   On: 02/09/2016 00:17   Procedures Procedures (including critical care time)  Medications Ordered in ED Medications   acetaminophen (TYLENOL) tablet 650 mg (650 mg Oral Given 02/08/16 2350)     Initial Impression / Assessment and Plan / ED Course  I have reviewed the triage vital signs and the nursing notes.  Pertinent labs & imaging results that were available during my care of the patient were reviewed by me and considered in my medical decision making (see chart for details).  Clinical Course    Respiratory tract infection-possible influenza. Abdominal pain seems to be simple gastritis. She will be treated with a GI cocktail. We'll check chest x-ray to make sure that she does not have pneumonia. Will give therapeutic trial of albuterol with ipratropium to try to treat her cough. Old records are reviewed confirming recent ED visit with diagnosis of viral URI.  Chest x-ray shows no evidence of pneumonia. She did have relief of cough with nebulizer treatment so she is given prescriptions for albuterol inhaler and albuterol nebulizer solution.  Advised to take acetaminophen and/or ibuprofen for fever and aching. Follow-up with PCP. Return precautions discussed.  Final Clinical Impressions(s) / ED Diagnoses   Final diagnoses:  Viral URI with cough    New Prescriptions New Prescriptions   No medications on file     I personally performed the services described in this documentation, which was scribed in my presence. The recorded information has been reviewed and is accurate.       Dione Booze, MD 02/09/16 [phone]

## 2017-07-20 IMAGING — CR DG CHEST 2V
2 series · 2 of 2 positions shown · non-contrast
Comparison: None.

CLINICAL DATA: Cough and congestion

EXAM:
CHEST  2 VIEW

[w chest pa]
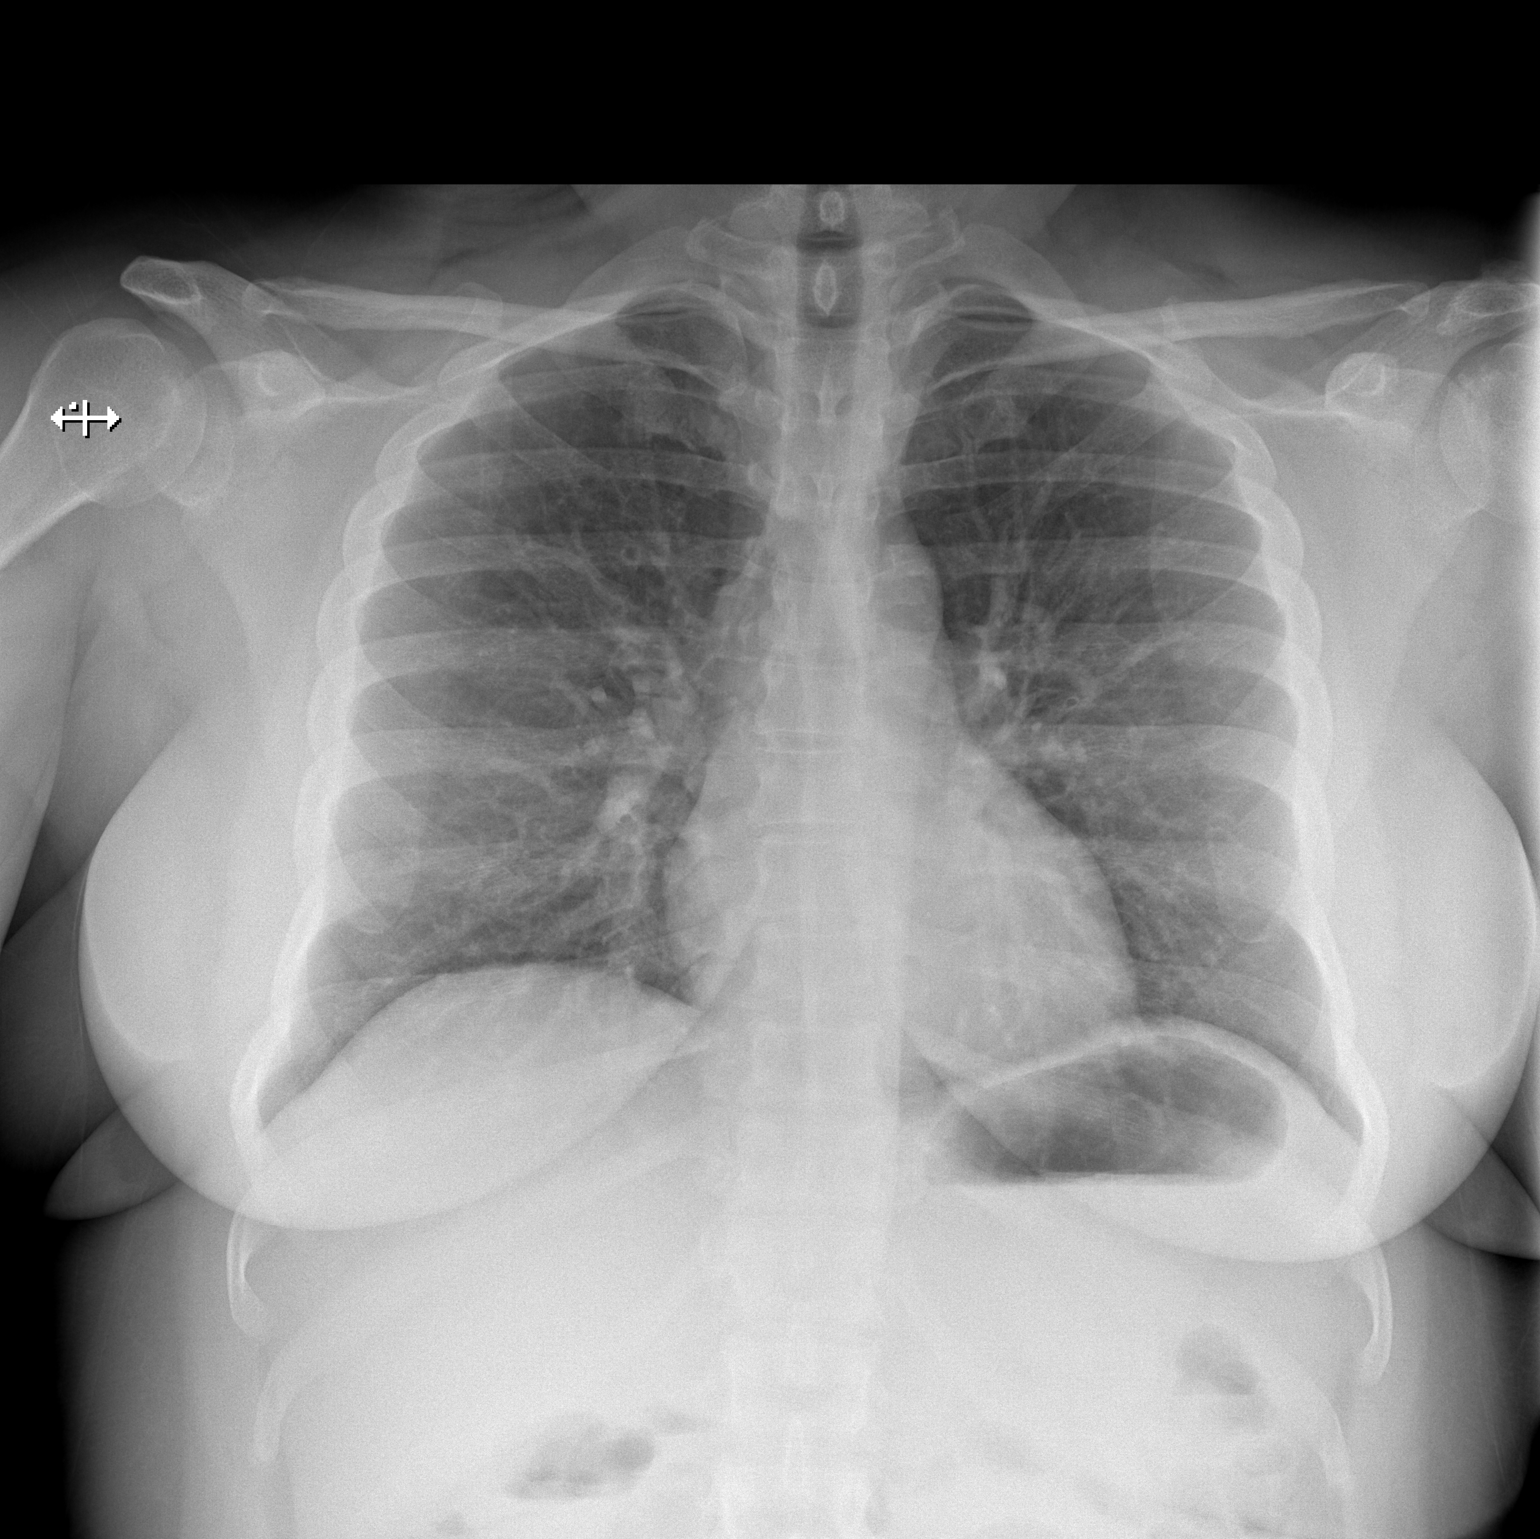

[w chest lat]
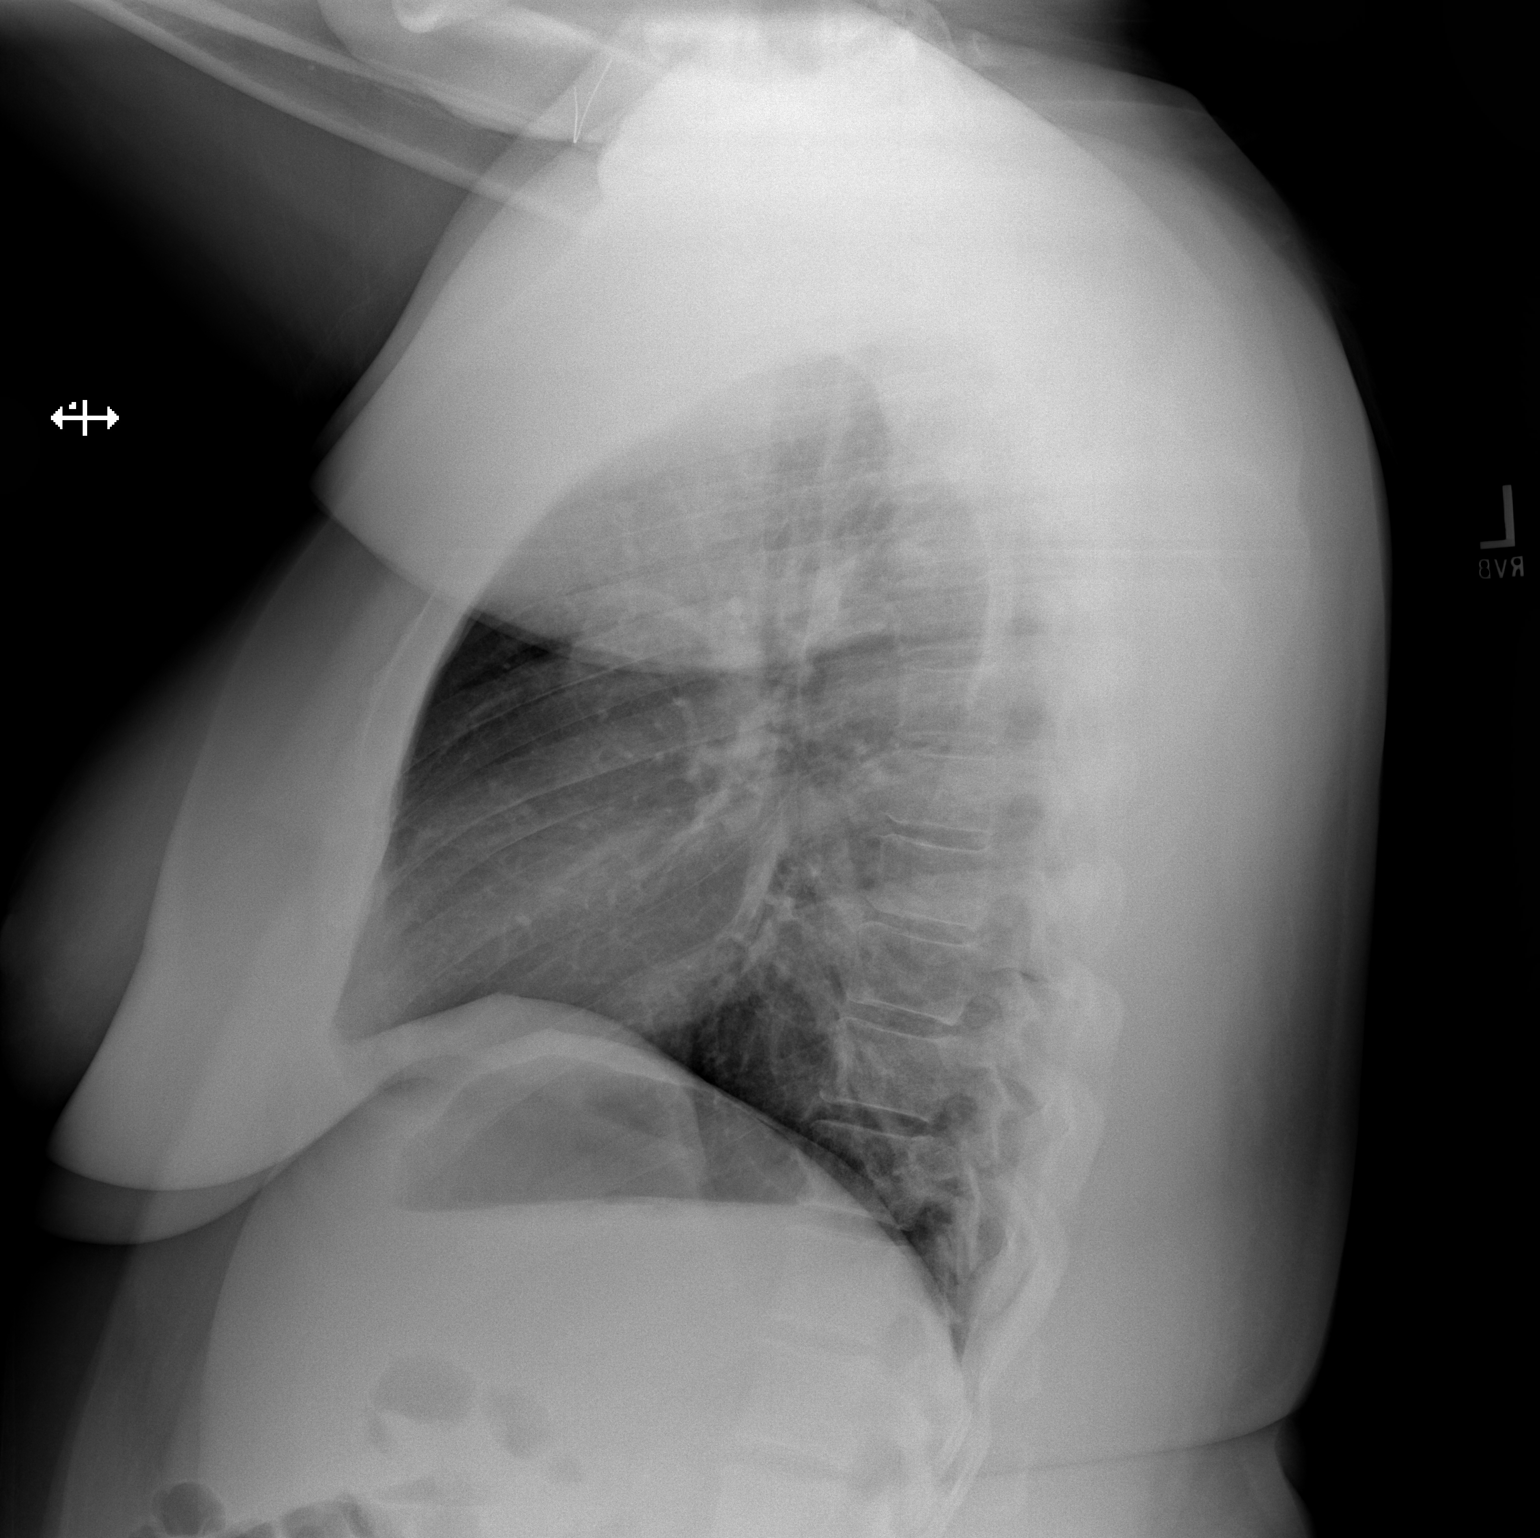

[2 of 2 positions shown; findings below may reference images not displayed]

FINDINGS: The heart size and mediastinal contours are within normal limits.
Both lungs are clear. The visualized skeletal structures are
unremarkable.
IMPRESSION: No active cardiopulmonary disease.

## 2017-11-17 ENCOUNTER — Encounter: Payer: Self-pay | Admitting: Family Medicine

## 2017-11-17 ENCOUNTER — Ambulatory Visit: Payer: Self-pay | Admitting: Family Medicine

## 2017-11-17 VITALS — BP 130/80 | HR 77 | Temp 98.4°F | Resp 17 | Ht 66.0 in | Wt 240.2 lb

## 2017-11-17 DIAGNOSIS — Z0000 Encounter for general adult medical examination without abnormal findings: Secondary | ICD-10-CM

## 2017-11-17 NOTE — Patient Instructions (Signed)

## 2017-11-17 NOTE — Progress Notes (Signed)
Kristi Fleming is a 42 y.o. female who presents today with concerns of need for a physical exam for employment. 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 She denies any chronic health conditions, denies use of daily medications, and has had a eye exam in the last year. She denies any acute health condition or mental illness at this time.  Review of Systems  Constitutional: Negative for chills, fever and malaise/fatigue.  HENT: Negative for congestion, ear discharge, ear pain, sinus pain and sore throat.   Eyes: Negative.   Respiratory: Negative for cough, sputum production and shortness of breath.   Cardiovascular: Negative.  Negative for chest pain.  Gastrointestinal: Negative for abdominal pain, diarrhea, nausea and vomiting.  Genitourinary: Negative for dysuria, frequency, hematuria and urgency.  Musculoskeletal: Negative for myalgias.  Skin: Negative.   Neurological: Negative for headaches.  Endo/Heme/Allergies: Negative.   Psychiatric/Behavioral: Negative.     O: Vitals:   11/17/17 1403  BP: 130/80  Pulse: 77  Resp: 17  Temp: 98.4 F (36.9 C)  SpO2: 97%     Physical Exam  Constitutional: She is oriented to person, place, and time. Vital signs are normal. She appears well-developed and well-nourished. She is active.  Non-toxic appearance. She does not have a sickly appearance.  HENT:  Head: Normocephalic.  Right Ear: Hearing, tympanic membrane, external ear and ear canal normal.  Left Ear: Hearing, tympanic membrane, external ear and ear canal normal.  Nose: Nose normal.  Mouth/Throat: Uvula is midline and oropharynx is clear and moist.  Neck: Normal range of motion. Neck supple.  Cardiovascular: Normal rate, regular rhythm, normal heart sounds and normal pulses.  Pulmonary/Chest: Effort normal and breath sounds normal.  Abdominal: Soft. Bowel sounds are normal.  Musculoskeletal: Normal range of motion.  Lymphadenopathy:       Head (right side): No  submental and no submandibular adenopathy present.       Head (left side): No submental and no submandibular adenopathy present.    She has no cervical adenopathy.  Neurological: She is alert and oriented to person, place, and time.  Psychiatric: She has a normal mood and affect. Her speech is normal and behavior is normal. Cognition and memory are normal.  PHQ-9  Vitals reviewed.    A: 1. Physical exam      P: Discussed exam findings, diagnosis etiology and medication use and indications reviewed with patient. Follow- Up and discharge instructions provided. No emergent/urgent issues found on exam.  Patient verbalized understanding of information provided and agrees with plan of care (POC), all questions answered.  1. Physical exam Exam completed WNL- no acute findings or concerns

## 2023-09-19 ENCOUNTER — Encounter: Admitting: Obstetrics and Gynecology

## 2023-09-19 DIAGNOSIS — Z131 Encounter for screening for diabetes mellitus: Secondary | ICD-10-CM | POA: Diagnosis not present

## 2023-09-19 DIAGNOSIS — M25561 Pain in right knee: Secondary | ICD-10-CM | POA: Diagnosis not present

## 2023-10-24 ENCOUNTER — Other Ambulatory Visit: Payer: Self-pay | Admitting: Physician Assistant

## 2023-10-24 DIAGNOSIS — Z13220 Encounter for screening for lipoid disorders: Secondary | ICD-10-CM | POA: Diagnosis not present

## 2023-10-24 DIAGNOSIS — Z6839 Body mass index (BMI) 39.0-39.9, adult: Secondary | ICD-10-CM | POA: Diagnosis not present

## 2023-10-24 DIAGNOSIS — Z0000 Encounter for general adult medical examination without abnormal findings: Secondary | ICD-10-CM | POA: Diagnosis not present

## 2023-10-24 DIAGNOSIS — I10 Essential (primary) hypertension: Secondary | ICD-10-CM | POA: Diagnosis not present

## 2023-10-24 DIAGNOSIS — Z9189 Other specified personal risk factors, not elsewhere classified: Secondary | ICD-10-CM | POA: Diagnosis not present

## 2023-10-24 DIAGNOSIS — Z1231 Encounter for screening mammogram for malignant neoplasm of breast: Secondary | ICD-10-CM

## 2023-10-24 DIAGNOSIS — Z131 Encounter for screening for diabetes mellitus: Secondary | ICD-10-CM | POA: Diagnosis not present

## 2023-10-24 DIAGNOSIS — R0789 Other chest pain: Secondary | ICD-10-CM | POA: Diagnosis not present

## 2023-11-08 DIAGNOSIS — N289 Disorder of kidney and ureter, unspecified: Secondary | ICD-10-CM | POA: Diagnosis not present

## 2023-11-08 DIAGNOSIS — E782 Mixed hyperlipidemia: Secondary | ICD-10-CM | POA: Diagnosis not present

## 2023-11-08 DIAGNOSIS — D75839 Thrombocytosis, unspecified: Secondary | ICD-10-CM | POA: Diagnosis not present

## 2023-11-08 DIAGNOSIS — Z6839 Body mass index (BMI) 39.0-39.9, adult: Secondary | ICD-10-CM | POA: Diagnosis not present

## 2023-11-08 DIAGNOSIS — I10 Essential (primary) hypertension: Secondary | ICD-10-CM | POA: Diagnosis not present

## 2023-11-08 DIAGNOSIS — E119 Type 2 diabetes mellitus without complications: Secondary | ICD-10-CM | POA: Diagnosis not present

## 2023-11-08 DIAGNOSIS — R0789 Other chest pain: Secondary | ICD-10-CM | POA: Diagnosis not present

## 2023-11-08 DIAGNOSIS — Z9189 Other specified personal risk factors, not elsewhere classified: Secondary | ICD-10-CM | POA: Diagnosis not present
